# Patient Record
Sex: Female | Born: 1980
Health system: Southern US, Community
[De-identification: ages and names within clinical notes are randomized; demographics above are authoritative.]

## PROBLEM LIST (undated history)

## (undated) DIAGNOSIS — G44019 Episodic cluster headache, not intractable: Secondary | ICD-10-CM

## (undated) DIAGNOSIS — F419 Anxiety disorder, unspecified: Secondary | ICD-10-CM

## (undated) DIAGNOSIS — N289 Disorder of kidney and ureter, unspecified: Secondary | ICD-10-CM

## (undated) DIAGNOSIS — G43109 Migraine with aura, not intractable, without status migrainosus: Secondary | ICD-10-CM

## (undated) DIAGNOSIS — D509 Iron deficiency anemia, unspecified: Secondary | ICD-10-CM

## (undated) DIAGNOSIS — R61 Generalized hyperhidrosis: Secondary | ICD-10-CM

## (undated) HISTORY — DX: Episodic cluster headache, not intractable: G44.019

## (undated) HISTORY — DX: Migraine with aura, not intractable, without status migrainosus: G43.109

## (undated) HISTORY — DX: Anxiety disorder, unspecified: F41.9

## (undated) HISTORY — DX: Iron deficiency anemia, unspecified: D50.9

## (undated) HISTORY — DX: Disorder of kidney and ureter, unspecified: N28.9

## (undated) HISTORY — DX: Generalized hyperhidrosis: R61

---

## 2008-04-18 ENCOUNTER — Observation Stay: Payer: Self-pay | Admitting: Unknown Physician Specialty

## 2008-08-17 ENCOUNTER — Observation Stay: Payer: Self-pay

## 2008-10-24 ENCOUNTER — Inpatient Hospital Stay: Payer: Self-pay

## 2009-09-16 HISTORY — PX: HERNIA REPAIR: SHX51

## 2010-09-06 ENCOUNTER — Ambulatory Visit: Payer: Self-pay | Admitting: Surgery

## 2010-09-11 ENCOUNTER — Ambulatory Visit: Payer: Self-pay | Admitting: Surgery

## 2012-06-11 ENCOUNTER — Ambulatory Visit: Payer: Self-pay

## 2014-08-30 ENCOUNTER — Ambulatory Visit: Payer: Self-pay | Admitting: Family Medicine

## 2015-01-04 DIAGNOSIS — D509 Iron deficiency anemia, unspecified: Secondary | ICD-10-CM | POA: Insufficient documentation

## 2015-01-04 DIAGNOSIS — N289 Disorder of kidney and ureter, unspecified: Secondary | ICD-10-CM | POA: Insufficient documentation

## 2015-01-04 DIAGNOSIS — G43109 Migraine with aura, not intractable, without status migrainosus: Secondary | ICD-10-CM | POA: Insufficient documentation

## 2015-01-04 DIAGNOSIS — G44019 Episodic cluster headache, not intractable: Secondary | ICD-10-CM | POA: Insufficient documentation

## 2015-02-21 ENCOUNTER — Ambulatory Visit: Payer: BLUE CROSS/BLUE SHIELD | Admitting: Family Medicine

## 2015-05-02 ENCOUNTER — Other Ambulatory Visit: Payer: Self-pay | Admitting: Family Medicine

## 2015-05-02 NOTE — Telephone Encounter (Signed)
Your patient.  Thanks 

## 2015-05-02 NOTE — Telephone Encounter (Signed)
Needs appointment, then I'll get her enough to get to appointment.

## 2015-05-08 ENCOUNTER — Ambulatory Visit (INDEPENDENT_AMBULATORY_CARE_PROVIDER_SITE_OTHER): Payer: BLUE CROSS/BLUE SHIELD | Admitting: Family Medicine

## 2015-05-08 ENCOUNTER — Encounter: Payer: Self-pay | Admitting: Family Medicine

## 2015-05-08 VITALS — BP 130/88 | HR 65 | Temp 97.7°F | Ht 64.7 in | Wt 124.6 lb

## 2015-05-08 DIAGNOSIS — F411 Generalized anxiety disorder: Secondary | ICD-10-CM

## 2015-05-08 DIAGNOSIS — F419 Anxiety disorder, unspecified: Secondary | ICD-10-CM | POA: Insufficient documentation

## 2015-05-08 DIAGNOSIS — Z113 Encounter for screening for infections with a predominantly sexual mode of transmission: Secondary | ICD-10-CM

## 2015-05-08 MED ORDER — SERTRALINE HCL 100 MG PO TABS: 100.0000 mg | ORAL_TABLET | Freq: Every day | ORAL | Status: DC

## 2015-05-08 NOTE — Progress Notes (Signed)
BP 130/88 mmHg  Pulse 65  Temp(Src) 97.7 F (36.5 C)  Ht 5' 4.7" (1.643 m)  Wt 124 lb 9.6 oz (56.518 kg)  BMI 20.94 kg/m2  SpO2 100%  LMP 05/04/2015 (Approximate)   Subjective:    Patient ID: Miranda Best, female    DOB: March 14, 1981, 34 y.o.   MRN: [id number]  HPI: Miranda Best is a 34 y.o. female  Chief Complaint  Patient presents with  . Anxiety  . HIV Screening   ANXIETY/STRESS- Feels like medication is really working and that the stress she is under is just normal. Doesn't feel like the dose needs to be adjusted. Feels well.  Duration:better Anxious mood: yes  Excessive worrying: yes Irritability: no  Sweating: no Nausea: no Palpitations:no Hyperventilation: no Panic attacks: no Agoraphobia: no  Obscessions/compulsions: no Depressed mood: no Depression screen PHQ 2/9 05/08/2015  Decreased Interest 1  Down, Depressed, Hopeless 0  PHQ - 2 Score 1    GAD7: 7 Anhedonia: no Weight changes: no Insomnia: no   Hypersomnia: no Fatigue/loss of energy: no Feelings of worthlessness: no Feelings of guilt: no Impaired concentration/indecisiveness: no Suicidal ideations: no  Crying spells: no Recent Stressors/Life Changes: no   Relationship problems: no   Family stress: no     Financial stress: no    Job stress: no    Recent death/loss: no  Relevant past medical, surgical, family and social history reviewed and updated as indicated. Interim medical history since our last visit reviewed. Allergies and medications reviewed and updated.  Review of Systems  Constitutional: Negative.   Respiratory: Negative.   Cardiovascular: Negative.   Psychiatric/Behavioral: Negative.     Per HPI unless specifically indicated above     Objective:    BP 130/88 mmHg  Pulse 65  Temp(Src) 97.7 F (36.5 C)  Ht 5' 4.7" (1.643 m)  Wt 124 lb 9.6 oz (56.518 kg)  BMI 20.94 kg/m2  SpO2 100%  LMP 05/04/2015 (Approximate)  Wt Readings from Last 3 Encounters:  05/08/15  124 lb 9.6 oz (56.518 kg)  01/03/15 118 lb (53.524 kg)    Physical Exam  Constitutional: She is oriented to person, place, and time. She appears well-developed and well-nourished. No distress.  HENT:  Head: Normocephalic and atraumatic.  Right Ear: Hearing normal.  Left Ear: Hearing normal.  Nose: Nose normal.  Eyes: Conjunctivae and lids are normal. Right eye exhibits no discharge. Left eye exhibits no discharge. No scleral icterus.  Cardiovascular: Normal rate and regular rhythm.  Exam reveals no gallop and no friction rub.   No murmur heard. Pulmonary/Chest: Effort normal and breath sounds normal. No respiratory distress. She has no wheezes. She has no rales. She exhibits no tenderness.  Musculoskeletal: Normal range of motion.  Neurological: She is alert and oriented to person, place, and time.  Skin: Skin is warm, dry and intact. No rash noted. No erythema. No pallor.  Psychiatric: She has a normal mood and affect. Her speech is normal and behavior is normal. Judgment and thought content normal. Cognition and memory are normal.  Nursing note and vitals reviewed.   No results found for this or any previous visit.    Assessment & Plan:   Problem List Items Addressed This Visit      Other   Anxiety disorder - Primary    Doing very well on current regimen. Doesn't want to change dose. Will continue current regimen and follow up in 6 months. Continue to monitor.  Other Visit Diagnoses    Routine screening for STI (sexually transmitted infection)        No concern- just has not been tested. Drawn today.     Relevant Orders    HIV antibody        Follow up plan: Return in about 6 months (around 11/08/2015) for Follow up anxiety.

## 2015-05-08 NOTE — Patient Instructions (Signed)
Stress and Stress Management Stress is a normal reaction to life events. It is what you feel when life demands more than you are used to or more than you can handle. Some stress can be useful. For example, the stress reaction can help you catch the last bus of the day, study for a test, or meet a deadline at work. But stress that occurs too often or for too long can cause problems. It can affect your emotional health and interfere with relationships and normal daily activities. Too much stress can weaken your immune system and increase your risk for physical illness. If you already have a medical problem, stress can make it worse. CAUSES  All sorts of life events may cause stress. An event that causes stress for one person may not be stressful for another person. Major life events commonly cause stress. These may be positive or negative. Examples include losing your job, moving into a new home, getting married, having a baby, or losing a loved one. Less obvious life events may also cause stress, especially if they occur day after day or in combination. Examples include working long hours, driving in traffic, caring for children, being in debt, or being in a difficult relationship. SIGNS AND SYMPTOMS Stress may cause emotional symptoms including, the following:  Anxiety. This is feeling worried, afraid, on edge, overwhelmed, or out of control.  Anger. This is feeling irritated or impatient.  Depression. This is feeling sad, down, helpless, or guilty.  Difficulty focusing, remembering, or making decisions. Stress may cause physical symptoms, including the following:   Aches and pains. These may affect your head, neck, back, stomach, or other areas of your body.  Tight muscles or clenched jaw.  Low energy or trouble sleeping. Stress may cause unhealthy behaviors, including the following:   Eating to feel better (overeating) or skipping meals.  Sleeping too little, too much, or both.  Working  too much or putting off tasks (procrastination).  Smoking, drinking alcohol, or using drugs to feel better. DIAGNOSIS  Stress is diagnosed through an assessment by your health care provider. Your health care provider will ask questions about your symptoms and any stressful life events.Your health care provider will also ask about your medical history and may order blood tests or other tests. Certain medical conditions and medicine can cause physical symptoms similar to stress. Mental illness can cause emotional symptoms and unhealthy behaviors similar to stress. Your health care provider may refer you to a mental health professional for further evaluation.  TREATMENT  Stress management is the recommended treatment for stress.The goals of stress management are reducing stressful life events and coping with stress in healthy ways.  Techniques for reducing stressful life events include the following:  Stress identification. Self-monitor for stress and identify what causes stress for you. These skills may help you to avoid some stressful events.  Time management. Set your priorities, keep a calendar of events, and learn to say "no." These tools can help you avoid making too many commitments. Techniques for coping with stress include the following:  Rethinking the problem. Try to think realistically about stressful events rather than ignoring them or overreacting. Try to find the positives in a stressful situation rather than focusing on the negatives.  Exercise. Physical exercise can release both physical and emotional tension. The key is to find a form of exercise you enjoy and do it regularly.  Relaxation techniques. These relax the body and mind. Examples include yoga, meditation, tai chi, biofeedback, deep  breathing, progressive muscle relaxation, listening to music, being out in nature, journaling, and other hobbies. Again, the key is to find one or more that you enjoy and can do  regularly.  Healthy lifestyle. Eat a balanced diet, get plenty of sleep, and do not smoke. Avoid using alcohol or drugs to relax.  Strong support network. Spend time with family, friends, or other people you enjoy being around.Express your feelings and talk things over with someone you trust. Counseling or talktherapy with a mental health professional may be helpful if you are having difficulty managing stress on your own. Medicine is typically not recommended for the treatment of stress.Talk to your health care provider if you think you need medicine for symptoms of stress. HOME CARE INSTRUCTIONS  Keep all follow-up visits as directed by your health care provider.  Take all medicines as directed by your health care provider. SEEK MEDICAL CARE IF:  Your symptoms get worse or you start having new symptoms.  You feel overwhelmed by your problems and can no longer manage them on your own. SEEK IMMEDIATE MEDICAL CARE IF:  You feel like hurting yourself or someone else. Document Released: 02/26/2001 Document Revised: 01/17/2014 Document Reviewed: 04/27/2013 Exit[employer] Patient Information 2015 [employer]. This information is not intended to replace advice given to you by your health care provider. Make sure you discuss any questions you have with your health care provider.  

## 2015-05-08 NOTE — Assessment & Plan Note (Signed)
Doing very well on current regimen. Doesn't want to change dose. Will continue current regimen and follow up in 6 months. Continue to monitor.

## 2015-05-09 ENCOUNTER — Encounter: Payer: Self-pay | Admitting: Family Medicine

## 2015-05-09 LAB — HIV ANTIBODY (ROUTINE TESTING W REFLEX): HIV Screen 4th Generation wRfx: NONREACTIVE

## 2015-10-19 ENCOUNTER — Telehealth: Payer: Self-pay

## 2015-10-19 NOTE — Telephone Encounter (Signed)
OK to wait for her appointment.

## 2015-10-19 NOTE — Telephone Encounter (Signed)
[facility] is requesting a 90 day supply of Sertraline 100 mg tablets.  Patient is not due back until the end of this month.

## 2015-10-19 NOTE — Telephone Encounter (Signed)
Please schedule appt

## 2015-10-20 NOTE — Telephone Encounter (Signed)
Tried to call patient, phone is not working will try again on Monday.

## 2015-10-23 NOTE — Telephone Encounter (Signed)
Unable to reach by phone letter sent to last listed address.

## 2015-10-26 ENCOUNTER — Encounter: Payer: Self-pay | Admitting: Family Medicine

## 2015-10-26 ENCOUNTER — Ambulatory Visit (INDEPENDENT_AMBULATORY_CARE_PROVIDER_SITE_OTHER): Payer: BLUE CROSS/BLUE SHIELD | Admitting: Family Medicine

## 2015-10-26 VITALS — BP 118/74 | HR 76 | Temp 98.7°F | Ht 63.7 in | Wt 122.0 lb

## 2015-10-26 DIAGNOSIS — D509 Iron deficiency anemia, unspecified: Secondary | ICD-10-CM | POA: Diagnosis not present

## 2015-10-26 DIAGNOSIS — F411 Generalized anxiety disorder: Secondary | ICD-10-CM

## 2015-10-26 DIAGNOSIS — Z0000 Encounter for general adult medical examination without abnormal findings: Secondary | ICD-10-CM | POA: Diagnosis not present

## 2015-10-26 LAB — CBC WITH DIFFERENTIAL/PLATELET
HEMOGLOBIN: 12.3 g/dL (ref 11.1–15.9)
Hematocrit: 34.9 % (ref 34.0–46.6)
Lymphocytes Absolute: 1.2 10*3/uL (ref 0.7–3.1)
Lymphs: 33 %
MCH: 31.8 pg (ref 26.6–33.0)
MCHC: 35.2 g/dL (ref 31.5–35.7)
MCV: 90 fL (ref 79–97)
MID (ABSOLUTE): 0.5 10*3/uL (ref 0.1–1.6)
MID: 13 %
Neutrophils Absolute: 1.9 10*3/uL (ref 1.4–7.0)
Neutrophils: 54 %
Platelets: 152 10*3/uL (ref 150–379)
RBC: 3.87 x10E6/uL (ref 3.77–5.28)
RDW: 12.4 % (ref 12.3–15.4)
WBC: 3.6 10*3/uL (ref 3.4–10.8)

## 2015-10-26 MED ORDER — SERTRALINE HCL 100 MG PO TABS: 100.0000 mg | ORAL_TABLET | Freq: Every day | ORAL | Status: DC

## 2015-10-26 MED ORDER — SERTRALINE HCL 50 MG PO TABS: 100.0000 mg | ORAL_TABLET | Freq: Every day | ORAL | Status: DC

## 2015-10-26 MED ORDER — SERTRALINE HCL 50 MG PO TABS: 50.0000 mg | ORAL_TABLET | Freq: Every day | ORAL | Status: DC

## 2015-10-26 NOTE — Assessment & Plan Note (Signed)
Under good control. Continue current regimen. Continue to monitor. Recheck 6 months.  

## 2015-10-26 NOTE — Progress Notes (Signed)
BP 118/74 mmHg  Pulse 76  Temp(Src) 98.7 F (37.1 C)  Ht 5' 3.7" (1.618 m)  Wt 122 lb (55.339 kg)  BMI 21.14 kg/m2  SpO2 100%  LMP 10/10/2015 (Exact Date)   Subjective:    Patient ID: Miranda Best, female    DOB: December 01, 1980, 35 y.o.   MRN: [id number]  HPI: Miranda Best is a 35 y.o. female  Chief Complaint  Patient presents with  . Anxiety   ANXIETY/STRESS- has been out of her medicine for about 2 weeks, and has not been feeling well. Normally it works great with no issues.  Duration:exacerbated Anxious mood: yes  Excessive worrying: yes Irritability: yes  Sweating: no Nausea: no Palpitations:no Hyperventilation: no Panic attacks: no Agoraphobia: no  Obscessions/compulsions: no Depressed mood: no Depression screen [facility] 2/9 10/26/2015 05/08/2015  Decreased Interest 1 1  Down, Depressed, Hopeless 0 0  PHQ - 2 Score 1 1   GAD 7 : Generalized Anxiety Score 10/26/2015  Nervous, Anxious, on Edge 2  Control/stop worrying 2  Worry too much - different things 2  Trouble relaxing 2  Restless 2  Easily annoyed or irritable 3  Afraid - awful might happen 2  Total GAD 7 Score 15  Anxiety Difficulty Somewhat difficult   Anhedonia: no Weight changes: no Insomnia: no   Hypersomnia: no Fatigue/loss of energy: no Feelings of worthlessness: no Feelings of guilt: no Impaired concentration/indecisiveness: yes Suicidal ideations: no  Crying spells: no Recent Stressors/Life Changes: no  ANEMIA Anemia status: controlled Etiology of anemia: Iron Def. Duration of anemia treatment: 1 year Compliance with treatment: excellent compliance Iron supplementation side effects: no Severity of anemia: mild Fatigue: no Decreased exercise tolerance: no  Dyspnea on exertion: no Palpitations: no Bleeding: no Pica: no   Relevant past medical, surgical, family and social history reviewed and updated as indicated. Interim medical history since our last visit reviewed. Allergies and  medications reviewed and updated.  Review of Systems  Constitutional: Negative.   Respiratory: Negative.   Cardiovascular: Negative.   Musculoskeletal: Negative.   Psychiatric/Behavioral: Negative.     Per HPI unless specifically indicated above     Objective:    BP 118/74 mmHg  Pulse 76  Temp(Src) 98.7 F (37.1 C)  Ht 5' 3.7" (1.618 m)  Wt 122 lb (55.339 kg)  BMI 21.14 kg/m2  SpO2 100%  LMP 10/10/2015 (Exact Date)  Wt Readings from Last 3 Encounters:  10/26/15 122 lb (55.339 kg)  05/08/15 124 lb 9.6 oz (56.518 kg)  01/03/15 118 lb (53.524 kg)    Physical Exam  Constitutional: She is oriented to person, place, and time. She appears well-developed and well-nourished. No distress.  HENT:  Head: Normocephalic and atraumatic.  Right Ear: Hearing normal.  Left Ear: Hearing normal.  Nose: Nose normal.  Eyes: Conjunctivae and lids are normal. Right eye exhibits no discharge. Left eye exhibits no discharge. No scleral icterus.  Cardiovascular: Normal rate, regular rhythm, normal heart sounds and intact distal pulses.  Exam reveals no gallop and no friction rub.   No murmur heard. Pulmonary/Chest: Effort normal and breath sounds normal. No respiratory distress. She has no wheezes. She has no rales. She exhibits no tenderness.  Musculoskeletal: Normal range of motion.  Neurological: She is alert and oriented to person, place, and time.  Skin: Skin is warm, dry and intact. No rash noted. No erythema. No pallor.  Psychiatric: She has a normal mood and affect. Her speech is normal and behavior is normal. Judgment  and thought content normal. Cognition and memory are normal.  Nursing note and vitals reviewed.   Results for orders placed or performed in visit on 05/08/15  HIV antibody  Result Value Ref Range   HIV Screen 4th Generation wRfx Non Reactive Non Reactive      Assessment & Plan:   Problem List Items Addressed This Visit      Other   Iron deficiency anemia     Under good control on oral iron. Continue current regimen. Continue to monitor.       Relevant Orders   CBC With Differential/Platelet   Anxiety disorder - Primary    Under good control. Continue current regimen. Continue to monitor. Recheck 6 months.           Follow up plan: Return in about 6 months (around 04/24/2016) for Physical/Follow up Anxiety.

## 2015-10-26 NOTE — Assessment & Plan Note (Signed)
Under good control on oral iron. Continue current regimen. Continue to monitor.

## 2015-12-16 ENCOUNTER — Other Ambulatory Visit: Payer: Self-pay | Admitting: Family Medicine

## 2016-03-11 ENCOUNTER — Ambulatory Visit (INDEPENDENT_AMBULATORY_CARE_PROVIDER_SITE_OTHER): Payer: BLUE CROSS/BLUE SHIELD | Admitting: Family Medicine

## 2016-03-11 ENCOUNTER — Encounter: Payer: Self-pay | Admitting: Family Medicine

## 2016-03-11 VITALS — BP 119/75 | HR 76 | Temp 99.2°F | Ht 63.7 in | Wt 127.0 lb

## 2016-03-11 DIAGNOSIS — L259 Unspecified contact dermatitis, unspecified cause: Secondary | ICD-10-CM | POA: Diagnosis not present

## 2016-03-11 MED ORDER — TRIAMCINOLONE ACETONIDE 0.5 % EX OINT: 1.0000 "application " | TOPICAL_OINTMENT | Freq: Two times a day (BID) | CUTANEOUS | Status: DC

## 2016-03-11 NOTE — Progress Notes (Signed)
BP 119/75 mmHg  Pulse 76  Temp(Src) 99.2 F (37.3 C)  Ht 5' 3.7" (1.618 m)  Wt 127 lb (57.607 kg)  BMI 22.00 kg/m2  LMP 02/17/2016 (Exact Date)   Subjective:    Patient ID: Miranda Best, female    DOB: [birth year], 35 y.o.   MRN: [id number]  HPI: Miranda Best is a 35 y.o. female  Chief Complaint  Patient presents with  . Rash    back of neck, x 2weeks, itching and spreading   RASH Duration:  2 weeks  Location: back of her neck  Itching: yes Burning: yes Redness: yes Oozing: no Scaling: no Blisters: no Painful: no Fevers: no Change in detergents/soaps/personal care products: no Recent illness: no Recent travel:no History of same: no Context: worse Alleviating factors: nothing Treatments attempted:hydrocortisone cream Shortness of breath: no  Throat/tongue swelling: no Myalgias/arthralgias: no  Relevant past medical, surgical, family and social history reviewed and updated as indicated. Interim medical history since our last visit reviewed. Allergies and medications reviewed and updated.  Review of Systems  Constitutional: Negative.   Respiratory: Negative.   Cardiovascular: Negative.   Musculoskeletal: Negative.   Skin: Positive for rash. Negative for color change, pallor and wound.  Psychiatric/Behavioral: Negative.     Per HPI unless specifically indicated above     Objective:    BP 119/75 mmHg  Pulse 76  Temp(Src) 99.2 F (37.3 C)  Ht 5' 3.7" (1.618 m)  Wt 127 lb (57.607 kg)  BMI 22.00 kg/m2  LMP 02/17/2016 (Exact Date)  Wt Readings from Last 3 Encounters:  03/11/16 127 lb (57.607 kg)  10/26/15 122 lb (55.339 kg)  05/08/15 124 lb 9.6 oz (56.518 kg)    Physical Exam  Constitutional: She is oriented to person, place, and time. She appears well-developed and well-nourished. No distress.  HENT:  Head: Normocephalic and atraumatic.  Right Ear: Hearing normal.  Left Ear: Hearing normal.  Nose: Nose normal.  Eyes: Conjunctivae and lids  are normal. Right eye exhibits no discharge. Left eye exhibits no discharge. No scleral icterus.  Pulmonary/Chest: Effort normal. No respiratory distress.  Musculoskeletal: Normal range of motion.  Neurological: She is alert and oriented to person, place, and time.  Skin: Skin is warm and intact. Rash noted. No erythema. No pallor.  Erythematous pustular rash in the creases of her neck  Psychiatric: She has a normal mood and affect. Her speech is normal and behavior is normal. Judgment and thought content normal. Cognition and memory are normal.  Nursing note and vitals reviewed.   Results for orders placed or performed in visit on 10/26/15  CBC With Differential/Platelet  Result Value Ref Range   WBC 3.6 3.4 - 10.8 x10E3/uL   RBC 3.87 3.77 - 5.28 x10E6/uL   Hemoglobin 12.3 11.1 - 15.9 g/dL   Hematocrit 40.934.9 81.134.0 - 46.6 %   MCV 90 79 - 97 fL   MCH 31.8 26.6 - 33.0 pg   MCHC 35.2 31.5 - 35.7 g/dL   RDW 91.412.4 78.212.3 - 95.615.4 %   Platelets 152 150 - 379 x10E3/uL   Neutrophils 54 %   Lymphs 33 %   MID 13 %   Neutrophils Absolute 1.9 1.4 - 7.0 x10E3/uL   Lymphocytes Absolute 1.2 0.7 - 3.1 x10E3/uL   MID (Absolute) 0.5 0.1 - 1.6 X10E3/uL      Assessment & Plan:   Problem List Items Addressed This Visit    None    Visit Diagnoses    Contact  dermatitis    -  Primary    Will treat with triamcinalone cream. Call if not getting better or getting worse.         Follow up plan: Return if symptoms worsen or fail to improve.

## 2016-04-11 ENCOUNTER — Encounter (INDEPENDENT_AMBULATORY_CARE_PROVIDER_SITE_OTHER): Payer: Self-pay

## 2016-04-26 ENCOUNTER — Encounter: Payer: BLUE CROSS/BLUE SHIELD | Admitting: Family Medicine

## 2016-05-10 ENCOUNTER — Encounter: Payer: BLUE CROSS/BLUE SHIELD | Admitting: Family Medicine

## 2016-06-25 ENCOUNTER — Ambulatory Visit: Payer: BLUE CROSS/BLUE SHIELD | Admitting: Family Medicine

## 2016-07-18 ENCOUNTER — Telehealth: Payer: Self-pay | Admitting: Family Medicine

## 2016-07-18 NOTE — Telephone Encounter (Signed)
Pt called to make an appt about her medication

## 2016-07-23 ENCOUNTER — Ambulatory Visit: Payer: Self-pay | Admitting: Family Medicine

## 2017-09-10 DIAGNOSIS — H5213 Myopia, bilateral: Secondary | ICD-10-CM | POA: Diagnosis not present

## 2017-10-03 DIAGNOSIS — R238 Other skin changes: Secondary | ICD-10-CM | POA: Diagnosis not present

## 2017-10-03 DIAGNOSIS — F411 Generalized anxiety disorder: Secondary | ICD-10-CM | POA: Diagnosis not present

## 2017-10-03 DIAGNOSIS — D500 Iron deficiency anemia secondary to blood loss (chronic): Secondary | ICD-10-CM | POA: Diagnosis not present

## 2017-10-03 DIAGNOSIS — R5383 Other fatigue: Secondary | ICD-10-CM | POA: Diagnosis not present

## 2017-10-03 DIAGNOSIS — Z0000 Encounter for general adult medical examination without abnormal findings: Secondary | ICD-10-CM | POA: Diagnosis not present

## 2017-10-03 DIAGNOSIS — N289 Disorder of kidney and ureter, unspecified: Secondary | ICD-10-CM | POA: Diagnosis not present

## 2017-10-03 DIAGNOSIS — L259 Unspecified contact dermatitis, unspecified cause: Secondary | ICD-10-CM | POA: Diagnosis not present

## 2017-12-05 DIAGNOSIS — Z3042 Encounter for surveillance of injectable contraceptive: Secondary | ICD-10-CM | POA: Diagnosis not present

## 2017-12-05 DIAGNOSIS — Z3202 Encounter for pregnancy test, result negative: Secondary | ICD-10-CM | POA: Diagnosis not present

## 2017-12-05 DIAGNOSIS — Z01419 Encounter for gynecological examination (general) (routine) without abnormal findings: Secondary | ICD-10-CM | POA: Diagnosis not present

## 2017-12-24 ENCOUNTER — Other Ambulatory Visit: Payer: Self-pay

## 2017-12-24 ENCOUNTER — Encounter: Payer: Self-pay | Admitting: Emergency Medicine

## 2017-12-24 ENCOUNTER — Ambulatory Visit
Admission: EM | Admit: 2017-12-24 | Discharge: 2017-12-24 | Disposition: A | Discharge status: Home / Self Care | Payer: 59 | Attending: Family Medicine | Admitting: Family Medicine

## 2017-12-24 DIAGNOSIS — J302 Other seasonal allergic rhinitis: Secondary | ICD-10-CM

## 2017-12-24 DIAGNOSIS — R0981 Nasal congestion: Secondary | ICD-10-CM | POA: Diagnosis not present

## 2017-12-24 DIAGNOSIS — J029 Acute pharyngitis, unspecified: Secondary | ICD-10-CM | POA: Diagnosis not present

## 2017-12-24 LAB — RAPID STREP SCREEN (MED CTR MEBANE ONLY): STREPTOCOCCUS, GROUP A SCREEN (DIRECT): NEGATIVE

## 2017-12-24 MED ORDER — FLUTICASONE PROPIONATE 50 MCG/ACT NA SUSP: 1.0000 | Freq: Every day | NASAL | 0 refills | Status: DC

## 2017-12-24 NOTE — ED Provider Notes (Signed)
[facility] ____________________________________________  Time seen: Approximately 0930 Am  I have reviewed the triage vital signs and the nursing notes.   HISTORY  Chief Complaint Nasal Congestion   HPI Miranda Best is a 37 y.o. female presenting for evaluation of 1 week of runny nose, nasal congestion, sneezing, coughing and puffy eyes.  States she does have a history of seasonal allergies with similar presentation, but states over-the-counter Allegra and Claritin have not seem to been doing anything for her.  Denies accompanying fever.  Reports some sore throat, described as mild but has been persistent.  Continues to eat and drink well.  States she has had to be outside a lot this week due to her son's baseball game, and feels pollen aggravating symptoms.  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  Has continue to remain active.  Denies sinus pain or pressure. Denies other aggravating or alleviating factors.  Percent of his feels well. Denies chest pain, shortness of breath, abdominal pain, or rash. Denies recent sickness. Denies recent antibiotic use.  Denies cardiac history or renal insufficiency.  Johnson, Megan P, DO: PCP No LMP recorded. Patient has had an injection.  Denies pregnancy.   Past Medical History:  Diagnosis Date  . Anxiety   . Episodic cluster headache   . Iron deficiency anemia   . Migraine with aura   . Night sweats    Thought to be from SSRI  . Renal insufficiency     Patient Active Problem List   Diagnosis Date Noted  . Anxiety disorder 05/08/2015  . Migraine with aura   . Iron deficiency anemia   . Renal insufficiency   . Episodic cluster headache     Past Surgical History:  Procedure Laterality Date  . HERNIA REPAIR  2011     No current facility-administered medications for this encounter.   Current Outpatient Medications:  .  medroxyPROGESTERone (DEPO-PROVERA) 150 MG/ML injection, inject 1 milliliter  intramuscularly every 11 to 12 WEEKS, Disp: , Rfl: 0 .  sertraline (ZOLOFT) 100 MG tablet, Take 1 tablet (100 mg total) by mouth daily., Disp: 90 tablet, Rfl: 3 .  triamcinolone ointment (KENALOG) 0.5 %, Apply 1 application topically 2 (two) times daily., Disp: 30 g, Rfl: 0 .  fluticasone (FLONASE) 50 MCG/ACT nasal spray, Place 1 spray into both nostrils daily., Disp: 1 g, Rfl: 0  Allergies Patient has no known allergies.  Family History  Problem Relation Age of Onset  . Migraines Mother   . Alzheimer's disease Maternal Grandmother   . Hypertension Paternal Grandmother   . Hypertension Father     Social History Social History   Tobacco Use  . Smoking status: Never Smoker  . Smokeless tobacco: Never Used  Substance Use Topics  . Alcohol use: Yes    Comment: Rarely  . Drug use: No    Review of Systems Constitutional: No fever/chills ENT: As above.  Cardiovascular: Denies chest pain. Respiratory: Denies shortness of breath. Gastrointestinal: No abdominal pain.  No nausea, no vomiting.  No diarrhea.   Genitourinary: Negative for dysuria. Musculoskeletal: Negative for back pain. Skin: Negative for rash.   ____________________________________________   PHYSICAL EXAM:  VITAL SIGNS: ED Triage Vitals  Enc Vitals Group     BP 12/24/17 0829 120/90     Pulse Rate 12/24/17 0829 82     Resp 12/24/17 0829 16     Temp 12/24/17 0829 99.2 F (37.3 C)     Temp Source 12/24/17 0829 Oral  SpO2 12/24/17 0829 100 %     Weight 12/24/17 0828 133 lb (60.3 kg)     Height 12/24/17 0828 5\' 4"  (1.626 m)     Head Circumference --      Peak Flow --      Pain Score 12/24/17 0828 9     Pain Loc --      Pain Edu? --      Excl. in GC? --    Constitutional: Alert and oriented. Well appearing and in no acute distress. Eyes: Conjunctivae are normal.  Head: Atraumatic. No sinus tenderness to palpation. No swelling. No erythema.  Ears: no erythema, normal TMs bilaterally.   Nose:Nasal  congestion with clear rhinorrhea  Mouth/Throat: Mucous membranes are moist. Mild pharyngeal erythema. No tonsillar swelling or exudate.  Posterior pharyngeal cobblestoning noted. Neck: No stridor.  No cervical spine tenderness to palpation. Hematological/Lymphatic/Immunilogical: No cervical lymphadenopathy. Cardiovascular: Normal rate, regular rhythm. Grossly normal heart sounds.  Good peripheral circulation. Respiratory: Normal respiratory effort.  No retractions. No wheezes, rales or rhonchi. Good air movement.  Musculoskeletal: Ambulatory with steady gait.  Neurologic:  Normal speech and language. No gait instability. Skin:  Skin appears warm, dry and intact. No rash noted. Psychiatric: Mood and affect are normal. Speech and behavior are normal.  ___________________________________________   LABS (all labs ordered are listed, but only abnormal results are displayed)  Labs Reviewed  RAPID STREP SCREEN ([facility] ONLY)  CULTURE, GROUP A STREP [facility])   ____________________________________________   PROCEDURES Procedures    INITIAL IMPRESSION / ASSESSMENT AND PLAN / ED COURSE  Pertinent labs & imaging results that were available during my care of the patient were reviewed by me and considered in my medical decision making (see chart for details).  Well-appearing patient.  No acute distress.  Quick strep negative, will culture.  Suspect allergic rhinitis.  Denies fever.  No sinus pressure.  Discussed no indication for antibiotic use at this time.  Encouraged over-the-counter antihistamine continued use, Rx for Flonase given, discussed over-the-counter Sudafed.  Encourage rest, fluids, supportive care and avoidance of triggers. Discussed indication, risks and benefits of medications with patient.  Discussed follow up with Primary care physician this week. Discussed follow up and return parameters including no resolution or any worsening concerns. Patient verbalized understanding and  agreed to plan.   ____________________________________________   FINAL CLINICAL IMPRESSION(S) / ED DIAGNOSES  Final diagnoses:  Seasonal allergic rhinitis, unspecified trigger     ED Discharge Orders        Ordered    fluticasone (FLONASE) 50 MCG/ACT nasal spray  Daily     12/24/17 0853       Note: This dictation was prepared with Dragon dictation along with smaller phrase technology. Any transcriptional errors that result from this process are unintentional.         Renford Dills, NP 12/24/17 1318

## 2017-12-24 NOTE — Discharge Instructions (Addendum)
Take medication as prescribed. Rest. Drink plenty of fluids.  ° °Follow up with your primary care physician this week as needed. Return to Urgent care for new or worsening concerns.  ° °

## 2017-12-24 NOTE — ED Triage Notes (Signed)
Patient in today c/o runny nose, sore throat, sneezing, coughing and puffy eyes x 1 week. Patient has tried OTC Claritin and Allegra. Patient denies fever.

## 2017-12-26 LAB — CULTURE, GROUP A STREP (THRC)

## 2018-02-20 DIAGNOSIS — Z3042 Encounter for surveillance of injectable contraceptive: Secondary | ICD-10-CM | POA: Diagnosis not present

## 2019-04-27 ENCOUNTER — Other Ambulatory Visit: Payer: Self-pay

## 2019-04-27 ENCOUNTER — Ambulatory Visit
Admission: EM | Admit: 2019-04-27 | Discharge: 2019-04-27 | Disposition: A | Discharge status: Home / Self Care | Payer: 59 | Attending: Urgent Care | Admitting: Urgent Care

## 2019-04-27 DIAGNOSIS — L259 Unspecified contact dermatitis, unspecified cause: Secondary | ICD-10-CM

## 2019-04-27 DIAGNOSIS — L03221 Cellulitis of neck: Secondary | ICD-10-CM

## 2019-04-27 MED ORDER — DEXAMETHASONE SODIUM PHOSPHATE 10 MG/ML IJ SOLN
10.0000 mg | Freq: Once | INTRAMUSCULAR | Status: AC
  Administered 2019-04-27: 18:00:00 10 mg via INTRAMUSCULAR

## 2019-04-27 MED ORDER — PREDNISONE 10 MG (21) PO TBPK: ORAL_TABLET | ORAL | 0 refills | Status: AC

## 2019-04-27 MED ORDER — DOXYCYCLINE HYCLATE 100 MG PO CAPS: 100.0000 mg | ORAL_CAPSULE | Freq: Two times a day (BID) | ORAL | 0 refills | Status: AC

## 2019-04-27 NOTE — ED Triage Notes (Signed)
Patient complains of rash all around neck that started on Sunday. Patient rash is raised and red, itches and burning.

## 2019-04-27 NOTE — Discharge Instructions (Addendum)
It was very nice seeing you today in clinic. Thank you for entrusting me with your care.  ° °Please utilize the medications that we discussed. Your prescriptions have been called in to your pharmacy.  ° °Make arrangements to follow up with your regular doctor in 1 week for re-evaluation if not improving. If your symptoms/condition worsens, please seek follow up care either here or in the ER. Please remember, our [facility] providers are "right here with you" when you need us.  ° °Again, it was my pleasure to take care of you today. Thank you for choosing our clinic. I hope that you start to feel better quickly.  ° °Shannon Balthazar, MSN, APRN, FNP-C, CEN °Advanced Practice Provider °[facility] ° °

## 2019-04-27 NOTE — ED Provider Notes (Signed)
[place]   Name: Miranda Best DOB: 1981/07/01 MRN: [id number] CSN: [id number] PCP: Dorcas CarrowJohnson, Megan P, DO  Arrival date and time:  04/27/19 1708  Chief Complaint:  Rash   NOTE: Prior to seeing the patient today, I have reviewed the triage nursing documentation and vital signs. Clinical staff has updated patient's PMH/PSHx, current medication list, and drug allergies/intolerances to ensure comprehensive history available to assist in medical decision making.   History:   HPI: Miranda Best is a 38 y.o. female who presents today with complaints of rash to her neck that initially declared 2 days ago. Rash started to the front of her neck and has progressively spread circumferentially. Patient denies any new medications, foods, soaps/body washes, laundry detergents, or cosmetics. She does not wear any types of necklaces. She denies anything being placed around her neck beyond her normal clothing, which his typically cotton based fabrics. She denies a past medical history significant for environmental allergies. She advises that she has never developed a skin eruption like this in the past. Rash is erythematous and indurated. She describes it as being both pruritic and painful (burning). She has not appreciated any drainage associated with the rash. There is no facial involvement; no rash to periorbital, paranasal, or perioral areas. Patient denies that she is not experiencing any shortness of breath or sensation of pharyngeal/laryngeal fullness. Despite her symptoms, patient has not taken any over the counter interventions to help improve/relieve her reported symptoms at home.   Past Medical History:  Diagnosis Date  . Anxiety   . Episodic cluster headache   . Iron deficiency anemia   . Migraine with aura   . Night sweats    Thought to be from SSRI  . Renal insufficiency     Past Surgical History:  Procedure Laterality Date  . HERNIA REPAIR  2011    Family History  Problem  Relation Age of Onset  . Migraines Mother   . Alzheimer's disease Maternal Grandmother   . Hypertension Paternal Grandmother   . Hypertension Father     Social History   Tobacco Use  . Smoking status: Never Smoker  . Smokeless tobacco: Never Used  Substance Use Topics  . Alcohol use: Yes    Comment: Rarely  . Drug use: No    Patient Active Problem List   Diagnosis Date Noted  . Anxiety disorder 05/08/2015  . Migraine with aura   . Iron deficiency anemia   . Renal insufficiency   . Episodic cluster headache     Home Medications:    No outpatient medications have been marked as taking for the 04/27/19 encounter [facility](Hospital Encounter).    Allergies:   Patient has no known allergies.  Review of Systems (ROS): Review of Systems  Constitutional: Negative for chills and fever.  Respiratory: Negative for cough, chest tightness, shortness of breath and wheezing.   Cardiovascular: Negative for chest pain and palpitations.  Skin: Positive for color change and rash.  Neurological: Negative for dizziness, syncope, weakness, numbness and headaches.  Hematological: Negative for adenopathy.  Psychiatric/Behavioral: The patient is nervous/anxious.   All other systems reviewed and are negative.    Vital Signs: Today's Vitals   04/27/19 1726 04/27/19 1728 04/27/19 1757  BP:  (!) 144/99   Pulse:  79   Resp:  18   Temp:  98.2 F (36.8 C)   TempSrc:  Oral   SpO2:  100%   Weight: 138 lb (62.6 kg)    Height: 5'  4" (1.626 m)    PainSc: 8   8     Physical Exam: Physical Exam  Constitutional: She is oriented to person, place, and time and well-developed, well-nourished, and in no distress.  HENT:  Head: Normocephalic and atraumatic.  Mouth/Throat: Uvula is midline, oropharynx is clear and moist and mucous membranes are normal. No posterior oropharyngeal edema or posterior oropharyngeal erythema.  Neck: Trachea normal and normal range of motion. Neck supple.  Cardiovascular:  Normal rate, regular rhythm, normal heart sounds and intact distal pulses. Exam reveals no gallop and no friction rub.  No murmur heard. Pulmonary/Chest: Effort normal and breath sounds normal. No respiratory distress. She has no wheezes. She has no rales.  Neurological: She is alert and oriented to person, place, and time. Gait normal. GCS score is 15.  Skin: Skin is warm and dry. Rash noted.  Rash wraps circumferentially around neck; spares about a 1-2 inch area to the posterior aspect of the neck. Fine maculopapular rash extends distally onto the chest wall, culminating just inferior to the clavicle. Area warm, erythematous, and significantly indurated. Exquisitely TTP. Patient with areas of cracking/compromised skin integrity posteriorly. See attached medical photo of LEFT lateal neck.   Psychiatric: Mood, memory, affect and judgment normal.  Nursing note and vitals reviewed.     Urgent Care Treatments / Results:   LABS: PLEASE NOTE: all labs that were ordered this encounter are listed, however only abnormal results are displayed. Labs Reviewed - No data to display  EKG: -None  RADIOLOGY: No results found.  PROCEDURES: Procedures  MEDICATIONS RECEIVED THIS VISIT: Medications  dexamethasone (DECADRON) injection 10 mg (10 mg Intramuscular Given 04/27/19 1754)    PERTINENT CLINICAL COURSE NOTES/UPDATES:   Initial Impression / Assessment and Plan / Urgent Care Course:  Pertinent labs & imaging results that were available during my care of the patient were personally reviewed by me and considered in my medical decision making (see lab/imaging section of note for values and interpretations).  Miranda Best is a 38 y.o. female who presents to [facility] today with complaints of Rash   Patient is well appearing overall in clinic today. She does not appear to be in any acute distress. Presenting symptoms (see HPI) and exam as documented above. Rash is significant. Pain,  and fear of the unknown, distressing to the patient. Eruption appears to be some sort of contact dermatitis. Initial plan for systemic steroids, however based on the appearance of the rash and it being on the neck, I wanted to get a second opinion.  Asked attending physician Lacinda Axon, DO) to look at the rash with me while patient in clinic. DO agrees that rash is atypical and most consistent with some form of contact dermatitis. Patient unable to identify triggering exposure. DO concerned about posterior neck with areas of cracking/compromised skin integrity. Recommending adding course of doxycycline to cover for cellulitis.   Treatment as follows:  Dexamethasone 10 mg IM today in clinic.   Rx: 10 day steroid taper (starts 04/28/2019)  Rx: 10 day course of doxycycline.   Cool compresses to help soothe skin.   Monitor for signs and symptoms of infection, which would include increased redness, swelling, streaking, drainage, pain, and the development of a fever.   May use Tylenol and/or Ibuprofen as needed for pain.  Follow up with primary care physician in 1 week for re-evaluation. If not improving, patient will need to see dermatology for a biopsy of the area to determine definitive diagnosis.  I have reviewed the follow up and strict return precautions for any new or worsening symptoms. Patient is aware of symptoms that would be deemed urgent/emergent, and would thus require further evaluation either here or in the emergency department. At the time of discharge, she verbalized understanding and consent with the discharge plan as it was reviewed with her. All questions were fielded by provider and/or clinic staff prior to patient discharge.    Final Clinical Impressions / Urgent Care Diagnoses:   Final diagnoses:  Contact dermatitis, unspecified contact dermatitis type, unspecified trigger  Cellulitis of neck    New Prescriptions:  [place] Controlled Substance Registry consulted? No  Meds  ordered this encounter  Medications  . dexamethasone (DECADRON) injection 10 mg  . predniSONE (STERAPRED UNI-PAK 21 TAB) 10 MG (21) TBPK tablet    Sig: Take 6 tabs by mouth daily  for 2 days, then 5 tabs for 2 days, then 4 tabs for 2 days, then 3 tabs for 2 days, 2 tabs for 2 days, then 1 tab by mouth daily for 2 days    Dispense:  42 tablet    Refill:  0  . doxycycline (VIBRAMYCIN) 100 MG capsule    Sig: Take 1 capsule (100 mg total) by mouth 2 (two) times daily.    Dispense:  20 capsule    Refill:  0    Recommended Follow up Care:  Patient encouraged to follow up with the following provider within the specified time frame, or sooner as dictated by the severity of her symptoms. As always, she was instructed that for any urgent/emergent care needs, she should seek care either here or in the emergency department for more immediate evaluation.  Follow-up Information    Laural BenesJohnson, Megan P, DO In 1 week.   Specialty: Family Medicine Contact information: [address] [place] [address] [phone]         NOTE: This note was prepared using Dragon dictation software along with smaller phrase technology. Despite my best ability to proofread, there is the potential that transcriptional errors may still occur from this process, and are completely unintentional.    Verlee MonteGray, Demontrez Rindfleisch E, NP 04/28/19 1300

## 2019-12-04 ENCOUNTER — Ambulatory Visit: Payer: Managed Care, Other (non HMO)

## 2019-12-14 ENCOUNTER — Other Ambulatory Visit: Payer: Self-pay | Admitting: Orthopedic Surgery

## 2019-12-14 DIAGNOSIS — M222X2 Patellofemoral disorders, left knee: Secondary | ICD-10-CM

## 2019-12-26 ENCOUNTER — Ambulatory Visit: Payer: Managed Care, Other (non HMO)

## 2020-01-20 ENCOUNTER — Other Ambulatory Visit: Payer: Self-pay

## 2020-01-20 ENCOUNTER — Ambulatory Visit
Admission: RE | Admit: 2020-01-20 | Discharge: 2020-01-20 | Disposition: A | Discharge status: Home / Self Care | Payer: Managed Care, Other (non HMO) | Source: Ambulatory Visit | Attending: Orthopedic Surgery | Admitting: Orthopedic Surgery

## 2020-01-20 DIAGNOSIS — M222X2 Patellofemoral disorders, left knee: Secondary | ICD-10-CM | POA: Insufficient documentation

## 2020-11-20 ENCOUNTER — Other Ambulatory Visit: Payer: Self-pay | Admitting: Obstetrics and Gynecology

## 2020-11-20 DIAGNOSIS — N920 Excessive and frequent menstruation with regular cycle: Secondary | ICD-10-CM

## 2021-02-02 ENCOUNTER — Ambulatory Visit: Payer: Self-pay | Admitting: Family Medicine

## 2021-02-06 ENCOUNTER — Ambulatory Visit: Payer: Self-pay | Admitting: Family Medicine

## 2021-03-15 ENCOUNTER — Encounter: Payer: Self-pay | Admitting: *Deleted

## 2021-03-15 ENCOUNTER — Emergency Department
Admission: EM | Admit: 2021-03-15 | Discharge: 2021-03-15 | Disposition: A | Discharge status: Home / Self Care | Payer: Managed Care, Other (non HMO) | Attending: Emergency Medicine | Admitting: Emergency Medicine

## 2021-03-15 ENCOUNTER — Other Ambulatory Visit: Payer: Self-pay

## 2021-03-15 DIAGNOSIS — R112 Nausea with vomiting, unspecified: Secondary | ICD-10-CM | POA: Diagnosis not present

## 2021-03-15 DIAGNOSIS — Z5321 Procedure and treatment not carried out due to patient leaving prior to being seen by health care provider: Secondary | ICD-10-CM | POA: Diagnosis not present

## 2021-03-15 DIAGNOSIS — R531 Weakness: Secondary | ICD-10-CM | POA: Insufficient documentation

## 2021-03-15 LAB — BASIC METABOLIC PANEL
Anion gap: 8 (ref 5–15)
BUN: 13 mg/dL (ref 6–20)
CO2: 22 mmol/L (ref 22–32)
Calcium: 8.4 mg/dL — ABNORMAL LOW (ref 8.9–10.3)
Chloride: 107 mmol/L (ref 98–111)
Creatinine, Ser: 1 mg/dL (ref 0.44–1.00)
GFR, Estimated: >60 mL/min (ref >60)
Glucose, Bld: 143 mg/dL — ABNORMAL HIGH (ref 70–99)
Potassium: 3 mmol/L — ABNORMAL LOW (ref 3.5–5.1)
Sodium: 137 mmol/L (ref 135–145)

## 2021-03-15 LAB — CBC
HCT: 29.4 % — ABNORMAL LOW (ref 36.0–46.0)
Hemoglobin: 10.4 g/dL — ABNORMAL LOW (ref 12.0–15.0)
MCH: 32.7 pg (ref 26.0–34.0)
MCHC: 35.4 g/dL (ref 30.0–36.0)
MCV: 92.5 fL (ref 80.0–100.0)
Platelets: 159 10*3/uL (ref 150–400)
RBC: 3.18 MIL/uL — ABNORMAL LOW (ref 3.87–5.11)
RDW: 12.1 % (ref 11.5–15.5)
WBC: 7.5 10*3/uL (ref 4.0–10.5)
nRBC: 0 % (ref 0.0–0.2)

## 2021-03-15 LAB — TROPONIN I (HIGH SENSITIVITY): Troponin I (High Sensitivity): <2 ng/L (ref <18)

## 2021-03-15 LAB — LIPASE, BLOOD: Lipase: 34 U/L (ref 11–51)

## 2021-03-15 NOTE — ED Notes (Signed)
20GA SL to left FA; d/c'd at pt request, cannula intact, dsng applied

## 2021-03-15 NOTE — ED Triage Notes (Signed)
Pt brought in via ems from home.  Pt reports feeling weak for 1 hour with n/v.  Iv in place 4mg  zofran given by ems.  Pt alert.

## 2021-03-23 ENCOUNTER — Other Ambulatory Visit: Payer: Self-pay | Admitting: Infectious Diseases

## 2021-03-23 DIAGNOSIS — R55 Syncope and collapse: Secondary | ICD-10-CM

## 2021-03-23 DIAGNOSIS — R61 Generalized hyperhidrosis: Secondary | ICD-10-CM

## 2021-03-23 DIAGNOSIS — R1900 Intra-abdominal and pelvic swelling, mass and lump, unspecified site: Secondary | ICD-10-CM

## 2021-03-23 DIAGNOSIS — R634 Abnormal weight loss: Secondary | ICD-10-CM

## 2021-03-27 ENCOUNTER — Other Ambulatory Visit: Payer: Self-pay | Admitting: Infectious Diseases

## 2021-03-27 DIAGNOSIS — R61 Generalized hyperhidrosis: Secondary | ICD-10-CM

## 2021-03-27 DIAGNOSIS — R634 Abnormal weight loss: Secondary | ICD-10-CM

## 2021-03-27 DIAGNOSIS — R55 Syncope and collapse: Secondary | ICD-10-CM

## 2021-03-27 DIAGNOSIS — R1900 Intra-abdominal and pelvic swelling, mass and lump, unspecified site: Secondary | ICD-10-CM

## 2021-03-30 ENCOUNTER — Other Ambulatory Visit: Payer: Self-pay

## 2021-03-30 ENCOUNTER — Ambulatory Visit
Admission: RE | Admit: 2021-03-30 | Discharge: 2021-03-30 | Disposition: A | Discharge status: Home / Self Care | Payer: Managed Care, Other (non HMO) | Source: Ambulatory Visit | Attending: Infectious Diseases | Admitting: Infectious Diseases

## 2021-03-30 DIAGNOSIS — R61 Generalized hyperhidrosis: Secondary | ICD-10-CM | POA: Insufficient documentation

## 2021-03-30 DIAGNOSIS — R55 Syncope and collapse: Secondary | ICD-10-CM | POA: Insufficient documentation

## 2021-03-30 DIAGNOSIS — R634 Abnormal weight loss: Secondary | ICD-10-CM | POA: Insufficient documentation

## 2021-03-30 DIAGNOSIS — R1900 Intra-abdominal and pelvic swelling, mass and lump, unspecified site: Secondary | ICD-10-CM | POA: Diagnosis present

## 2021-03-30 MED ORDER — IOHEXOL 300 MG/ML  SOLN
100.0000 mL | Freq: Once | INTRAMUSCULAR | Status: AC | PRN
  Administered 2021-03-30: 100 mL via INTRAVENOUS

## 2021-04-03 ENCOUNTER — Ambulatory Visit: Payer: Managed Care, Other (non HMO)

## 2021-10-27 IMAGING — CT CT CHEST-ABD-PELV W/ CM
2 of 4 series · 13 of 36 positions shown, 15 images · IV contrast (omnipaque)
Comparison: No priors.

CLINICAL DATA: 39-year-old female with history of weight loss of
more than 10% of body weight. Night sweats. Syncope. Palpable
abdominal mass.

EXAM:
CT CHEST, ABDOMEN, AND PELVIS WITH CONTRAST
TECHNIQUE: Multidetector CT imaging of the chest, abdomen and pelvis was
performed following the standard protocol during bolus
administration of intravenous contrast.
CONTRAST:  100mL OMNIPAQUE IOHEXOL 300 MG/ML  SOLN

[Series 2: cap with · axial · 0.76mm/px · z∈[-557,-22]mm · 10 of 129 slices shown, 12 images]
[im 11/129  mediastinal]
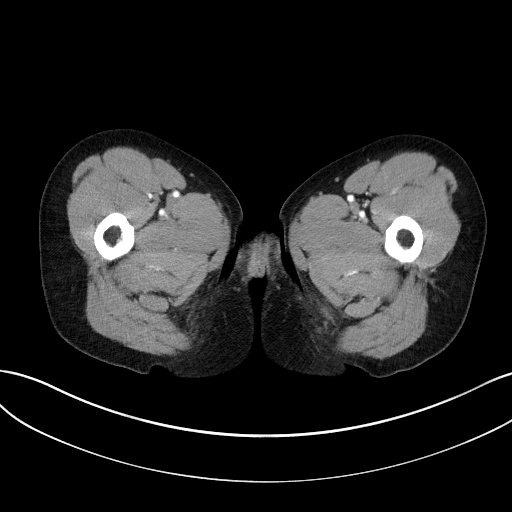
[im 11/129  bone]
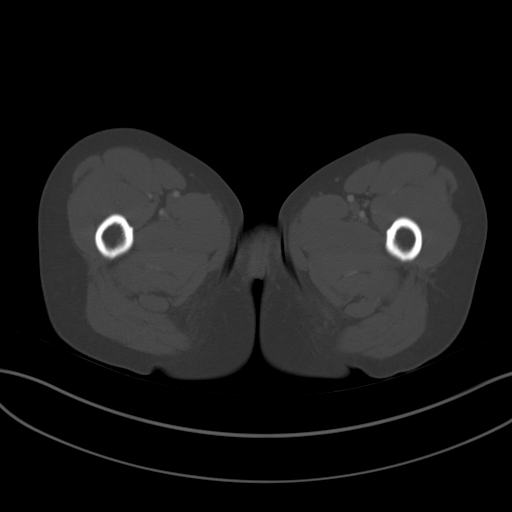
[im 22/129  mediastinal]
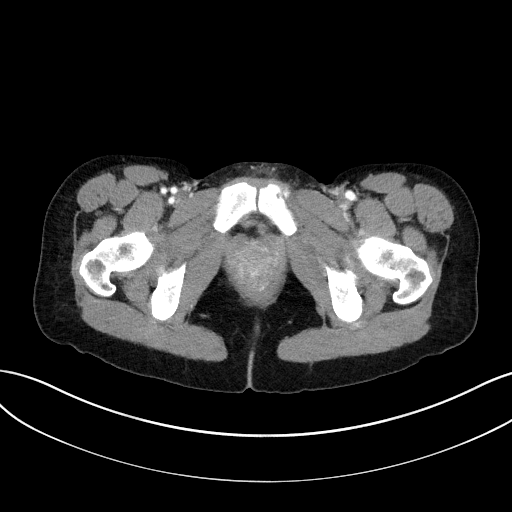
[im 33/129  mediastinal]
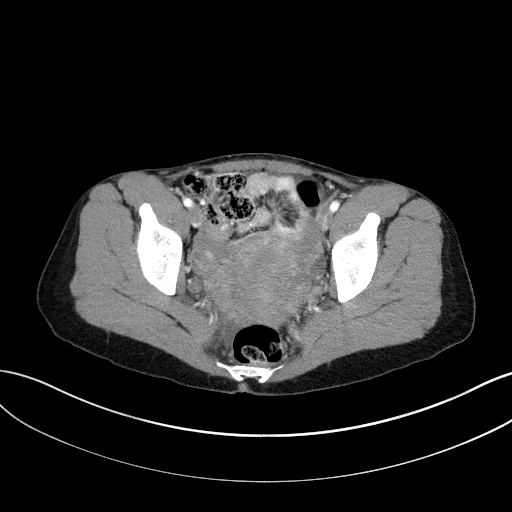
[im 43/129  mediastinal]
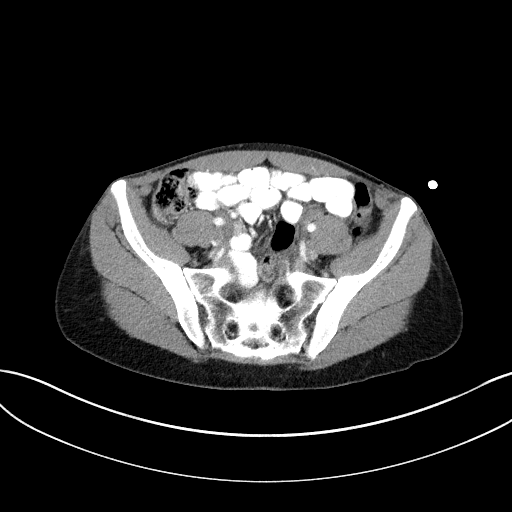
[im 54/129  mediastinal]
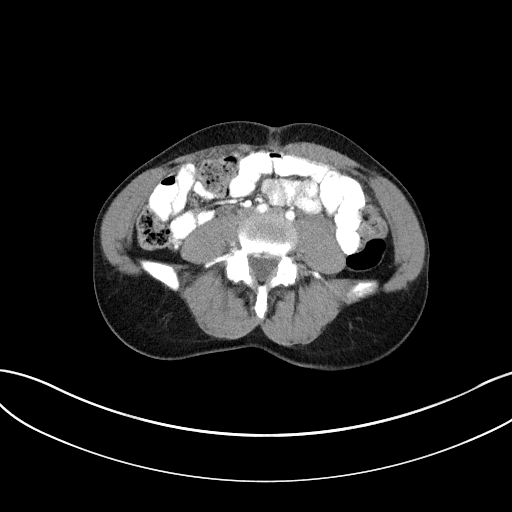
[im 75/129  mediastinal]
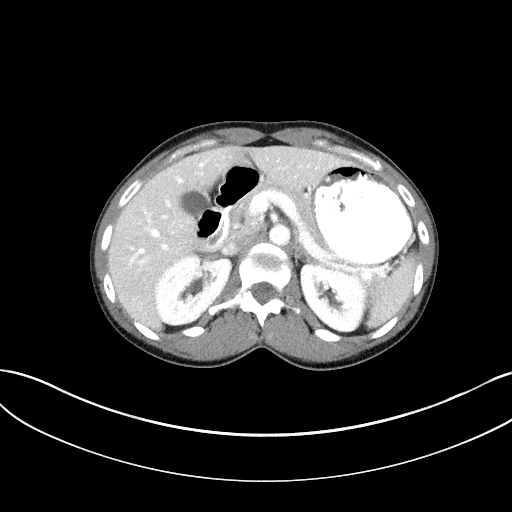
[im 86/129  mediastinal]
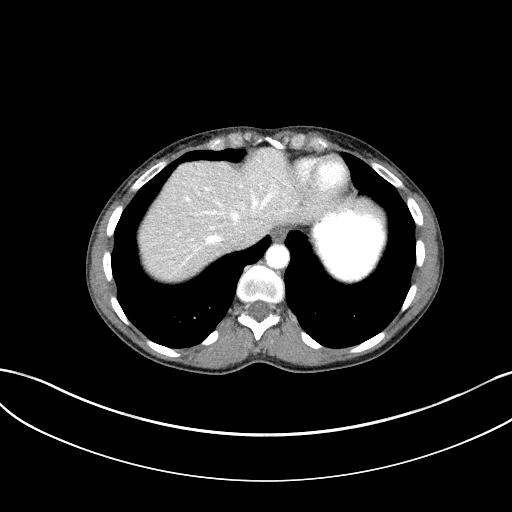
[im 97/129  mediastinal]
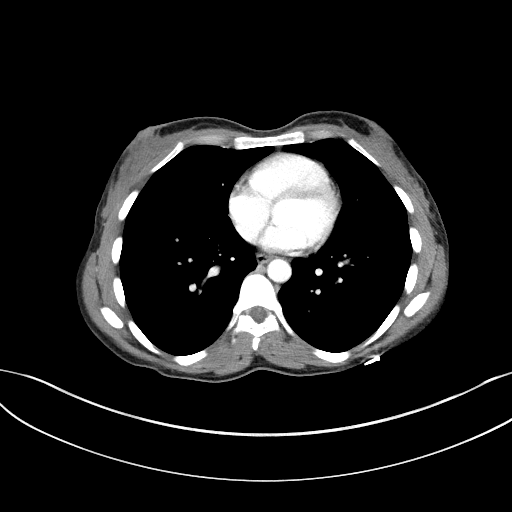
[im 107/129  mediastinal]
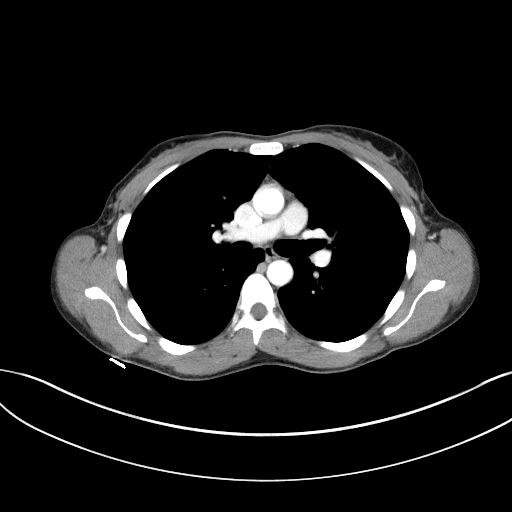
[im 107/129  bone]
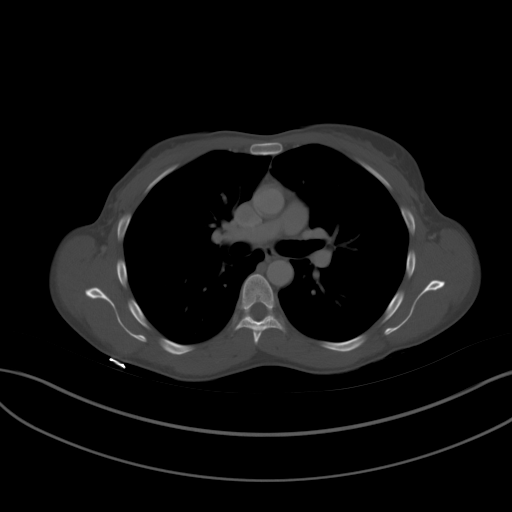
[im 118/129  mediastinal]
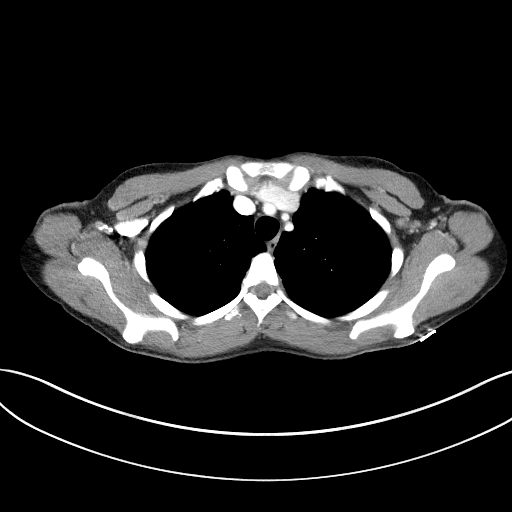

[Series 5: coronals · coronal · 0.64mm/px · 3 of 137 slices shown]
[im 28/137  mediastinal]
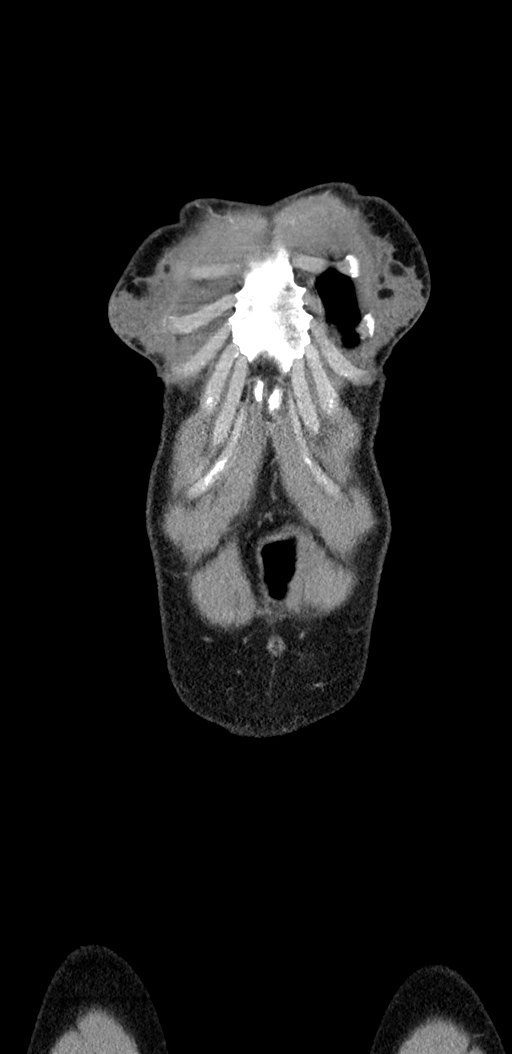
[im 55/137  mediastinal]
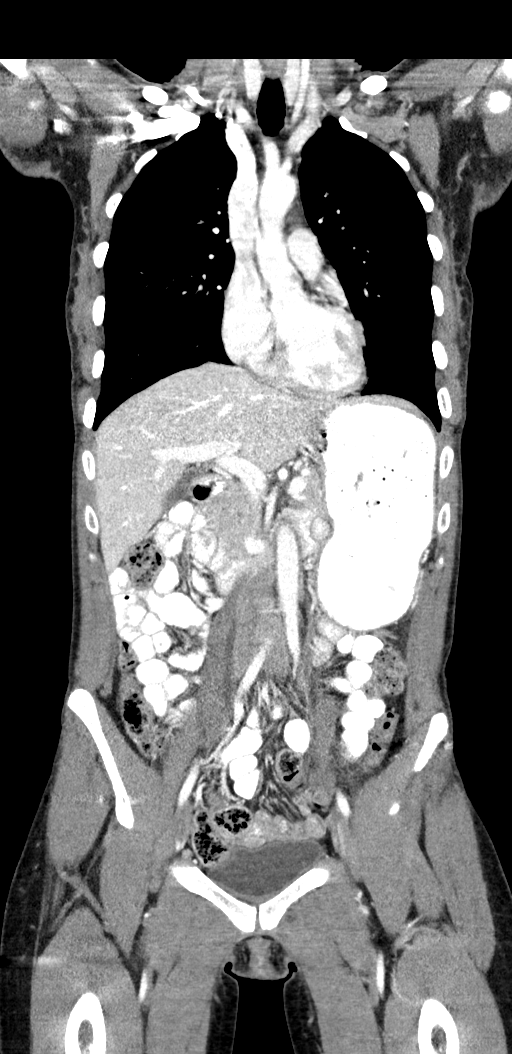
[im 82/137  mediastinal]
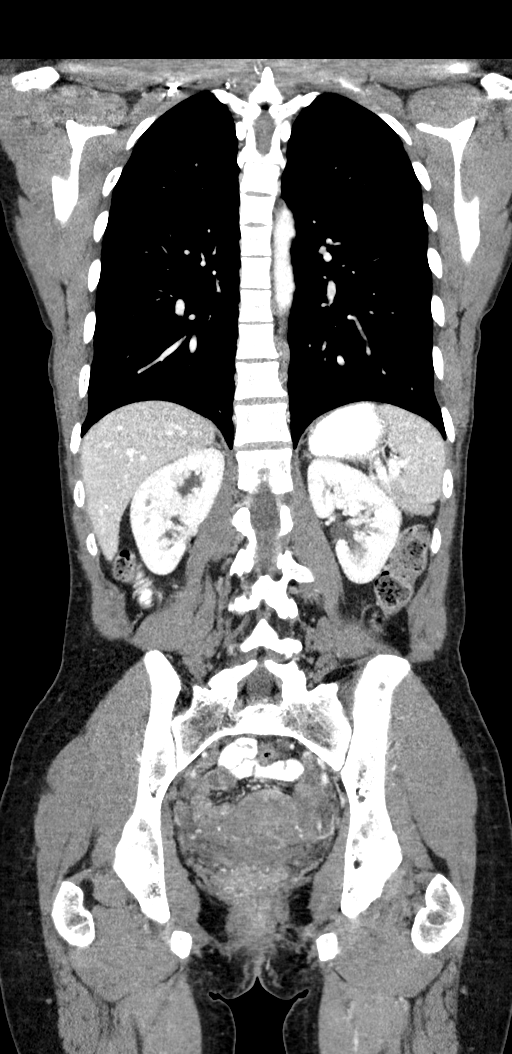

[13 of 36 positions shown; findings below may reference images not displayed]

FINDINGS: CT CHEST FINDINGS

Cardiovascular: Heart size is normal. There is no significant
pericardial fluid, thickening or pericardial calcification. No
atherosclerotic calcifications in the thoracic aorta or the coronary
arteries.

Mediastinum/Nodes: No pathologically enlarged mediastinal or hilar
lymph nodes. Esophagus is unremarkable in appearance. No axillary
lymphadenopathy.

Lungs/Pleura: No suspicious appearing pulmonary nodules or masses
are noted. No acute consolidative airspace disease. No pleural
effusions.

Musculoskeletal: There are no aggressive appearing lytic or blastic
lesions noted in the visualized portions of the skeleton.

CT ABDOMEN PELVIS FINDINGS

Hepatobiliary: No suspicious cystic or solid hepatic lesions. No
intra or extrahepatic biliary ductal dilatation. Gallbladder is
normal in appearance.

Pancreas: No pancreatic mass. No pancreatic ductal dilatation. No
pancreatic or peripancreatic fluid collections or inflammatory
changes.

Spleen: Unremarkable.

Adrenals/Urinary Tract: Bilateral kidneys and bilateral adrenal
glands are normal in appearance. No hydroureteronephrosis. Urinary
bladder is normal in appearance.

Stomach/Bowel: The appearance of the stomach is normal. There is no
pathologic dilatation of small bowel or colon. Normal appendix.

Vascular/Lymphatic: No significant atherosclerotic disease, aneurysm
or dissection noted in the abdominal or pelvic vasculature. No
lymphadenopathy noted in the abdomen or pelvis.

Reproductive: Uterus and ovaries are unremarkable in appearance.

Other: No significant volume of ascites.  No pneumoperitoneum.

Musculoskeletal: There are no aggressive appearing lytic or blastic
lesions noted in the visualized portions of the skeleton.
IMPRESSION: 1. No findings in the chest, abdomen or pelvis to account for the
patient's symptoms or unexplained weight loss.

## 2022-09-17 ENCOUNTER — Ambulatory Visit: Admit: 2022-09-17 | Discharge status: Against Medical Advice | Payer: Managed Care, Other (non HMO)

## 2024-02-02 ENCOUNTER — Other Ambulatory Visit: Payer: Self-pay | Admitting: Infectious Diseases

## 2024-02-02 DIAGNOSIS — Z1231 Encounter for screening mammogram for malignant neoplasm of breast: Secondary | ICD-10-CM

## 2024-02-28 ENCOUNTER — Encounter

## 2024-04-15 ENCOUNTER — Ambulatory Visit
Admission: RE | Admit: 2024-04-15 | Discharge: 2024-04-15 | Disposition: A | Discharge status: Home / Self Care | Attending: Emergency Medicine | Admitting: Emergency Medicine

## 2024-04-15 VITALS — BP 128/83 | HR 81 | Temp 98.2°F | Resp 18

## 2024-04-15 DIAGNOSIS — U071 COVID-19: Secondary | ICD-10-CM

## 2024-04-15 DIAGNOSIS — B349 Viral infection, unspecified: Secondary | ICD-10-CM | POA: Diagnosis not present

## 2024-04-15 LAB — POC SOFIA SARS ANTIGEN FIA: SARS Coronavirus 2 Ag: POSITIVE — AB

## 2024-04-15 MED ORDER — PAXLOVID (300/100) 20 X 150 MG & 10 X 100MG PO TBPK: 3.0000 | ORAL_TABLET | Freq: Two times a day (BID) | ORAL | 0 refills | Status: AC

## 2024-04-15 NOTE — Discharge Instructions (Signed)
 COVID-19 is a virus and should steadily improve in time it can take up to 7 to 10 days before you truly start to see a turnaround however things will get better  Per the CDC you will need to quarantine until you have 24 hours without a fever, once without fever please wear a mask but may continue activity as normal  Begin Paxlovid  taking twice daily for 5 days, this is a antiviral which reduces and suppresses the virus which helps to minimize symptoms, does not fully take away your illness  Paxlovid  can reduce the effectiveness of your birth control therefore if you are not wanting pregnancy you need to use a second method of contraception    You can take Tylenol and/or Ibuprofen as needed for fever reduction and pain relief.   For cough: honey 1/2 to 1 teaspoon (you can dilute the honey in water or another fluid).  You can also use guaifenesin and dextromethorphan for cough. You can use a humidifier for chest congestion and cough.  If you don't have a humidifier, you can sit in the bathroom with the hot shower running.      For sore throat: try warm salt water gargles, cepacol lozenges, throat spray, warm tea or water with lemon/honey, popsicles or ice, or OTC cold relief medicine for throat discomfort.   For congestion: take a daily anti-histamine like Zyrtec, Claritin, and a oral decongestant, such as pseudoephedrine.  You can also use Flonase  1-2 sprays in each nostril daily.   It is important to stay hydrated: drink plenty of fluids (water, gatorade/powerade/pedialyte, juices, or teas) to keep your throat moisturized and help further relieve irritation/discomfort.

## 2024-04-15 NOTE — ED Provider Notes (Signed)
 CAY RALPH PELT    CSN: [id number] Arrival date & time: 04/15/24  1041      History   Chief Complaint Chief Complaint  Patient presents with   Chills    Headache fever chills sore throat - Entered by patient    HPI Lazariah L Gundlach is a 43 y.o. female.   Patient presents for evaluation of fever peaking at 100.2, rhinorrhea, nonproductive cough, intermittent headaches and a sore throat beginning 1 day ago.  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  Tolerable to food and liquids.  Has attempted use of NyQuil.  Denies shortness of breath or wheezing.  Denies respiratory history, non-smoker.   Past Medical History:  Diagnosis Date   Anxiety    Episodic cluster headache    Iron deficiency anemia    Migraine with aura    Night sweats    Thought to be from SSRI   Renal insufficiency     Patient Active Problem List   Diagnosis Date Noted   Anxiety disorder 05/08/2015   Migraine with aura    Iron deficiency anemia    Renal insufficiency    Episodic cluster headache     Past Surgical History:  Procedure Laterality Date   HERNIA REPAIR  2011    OB History   No obstetric history on file.      Home Medications    Prior to Admission medications   Medication Sig Start Date End Date Taking? Authorizing Provider  DULoxetine (CYMBALTA) 30 MG capsule Take 30 mg by mouth. 02/04/24  Yes [provider]  nirmatrelvir/ritonavir (PAXLOVID , 300/100,) 20 x 150 MG & 10 x 100MG  TBPK Take 3 tablets by mouth 2 (two) times daily for 5 days. Patient GFR is 68. Take nirmatrelvir (150 mg) two tablets twice daily for 5 days and ritonavir (100 mg) one tablet twice daily for 5 days. 04/15/24 04/20/24 Yes Teresa Shelba SAUNDERS, NP  norethindrone (MICRONOR) 0.35 MG tablet Take 0.35 mg by mouth. 02/04/24 02/03/25 Yes [provider]  nortriptyline (PAMELOR) 25 MG capsule Take 1 capsule by mouth at bedtime. 01/13/24  Yes [provider]   busPIRone (BUSPAR) 5 MG tablet Take 5 mg by mouth 2 (two) times daily.    [provider]  doxycycline  (VIBRAMYCIN ) 100 MG capsule Take 1 capsule (100 mg total) by mouth 2 (two) times daily. Patient not taking: Reported on 04/15/2024 04/27/19   Elnor Dorise BRAVO, NP  predniSONE  (STERAPRED UNI-PAK 21 TAB) 10 MG (21) TBPK tablet Take 6 tabs by mouth daily  for 2 days, then 5 tabs for 2 days, then 4 tabs for 2 days, then 3 tabs for 2 days, 2 tabs for 2 days, then 1 tab by mouth daily for 2 days Patient not taking: Reported on 04/15/2024 04/27/19   Elnor Dorise BRAVO, NP  SUMAtriptan (IMITREX) 100 MG tablet Take 100 mg by mouth as directed.    [provider]  fluticasone  (FLONASE ) 50 MCG/ACT nasal spray Place 1 spray into both nostrils daily. 12/24/17 04/27/19  Cleotilde Jacobsen, NP  medroxyPROGESTERone (DEPO-PROVERA) 150 MG/ML injection inject 1 milliliter intramuscularly every 11 to 12 WEEKS 02/28/16 04/27/19  [provider]  sertraline  (ZOLOFT ) 100 MG tablet Take 1 tablet (100 mg total) by mouth daily. 10/26/15 04/27/19  Vicci Duwaine SQUIBB, DO    Family History Family History  Problem Relation Age of Onset   Migraines Mother    Alzheimer's disease Maternal Grandmother    Hypertension Paternal  Grandmother    Hypertension Father     Social History Social History   Tobacco Use   Smoking status: Never   Smokeless tobacco: Never  Vaping Use   Vaping status: Never Used  Substance Use Topics   Alcohol use: Not Currently    Comment: Rarely   Drug use: No     Allergies   Patient has no known allergies.   Review of Systems Review of Systems   Physical Exam Triage Vital Signs ED Triage Vitals  Encounter Vitals Group     BP 04/15/24 1051 128/83     Girls Systolic BP Percentile --      Girls Diastolic BP Percentile --      Boys Systolic BP Percentile --      Boys Diastolic BP Percentile --      Pulse Rate 04/15/24 1051 81     Resp 04/15/24 1051 18     Temp 04/15/24 1051  98.2 F (36.8 C)     Temp src --      SpO2 04/15/24 1051 99 %     Weight --      Height --      Head Circumference --      Peak Flow --      Pain Score 04/15/24 1058 6     Pain Loc --      Pain Education --      Exclude from Growth Chart --    No data found.  Updated Vital Signs BP 128/83   Pulse 81   Temp 98.2 F (36.8 C)   Resp 18   SpO2 99%   Visual Acuity Right Eye Distance:   Left Eye Distance:   Bilateral Distance:    Right Eye Near:   Left Eye Near:    Bilateral Near:     Physical Exam Constitutional:      Appearance: Normal appearance.  HENT:     Head: Normocephalic.     Right Ear: Tympanic membrane, ear canal and external ear normal.     Left Ear: Tympanic membrane, ear canal and external ear normal.     Nose: Congestion present.     Mouth/Throat:     Pharynx: Posterior oropharyngeal erythema present. No oropharyngeal exudate.  Eyes:     Extraocular Movements: Extraocular movements intact.  Cardiovascular:     Rate and Rhythm: Normal rate and regular rhythm.     Pulses: Normal pulses.     Heart sounds: Normal heart sounds.  Pulmonary:     Effort: Pulmonary effort is normal.     Breath sounds: Normal breath sounds.  Musculoskeletal:     Cervical back: Normal range of motion and neck supple.  Neurological:     Mental Status: She is alert and oriented to person, place, and time. Mental status is at baseline.      UC Treatments / Results  Labs (all labs ordered are listed, but only abnormal results are displayed) Labs Reviewed  POC SOFIA SARS ANTIGEN FIA - Abnormal; Notable for the following components:      Result Value   SARS Coronavirus 2 Ag Positive (*)    All other components within normal limits    EKG   Radiology No results found.  Procedures Procedures (including critical care time)  Medications Ordered in UC Medications - No data to display  Initial Impression / Assessment and Plan / UC Course  I have reviewed the triage  vital signs and the nursing notes.  Pertinent  labs & imaging results that were available during my care of the patient were reviewed by me and considered in my medical decision making (see chart for details).  COVID-19  Patient is in no signs of distress nor toxic appearing.  Vital signs are stable.  Low suspicion for pneumonia, pneumothorax or bronchitis and therefore will defer imaging.  Discussed CDC quarantine guidelines.  Prescribed Paxlovid  and discussed administration.May use additional over-the-counter medications as needed for supportive care.  May follow-up with urgent care as needed if symptoms persist or worsen.  Note given.   Final Clinical Impressions(s) / UC Diagnoses   Final diagnoses:  Viral illness  COVID-19     Discharge Instructions      COVID-19 is a virus and should steadily improve in time it can take up to 7 to 10 days before you truly start to see a turnaround however things will get better  Per the CDC you will need to quarantine until you have 24 hours without a fever, once without fever please wear a mask but may continue activity as normal  Begin Paxlovid  taking twice daily for 5 days, this is a antiviral which reduces and suppresses the virus which helps to minimize symptoms, does not fully take away your illness  Paxlovid  can reduce the effectiveness of your birth control therefore if you are not wanting pregnancy you need to use a second method of contraception    You can take Tylenol and/or Ibuprofen as needed for fever reduction and pain relief.   For cough: honey 1/2 to 1 teaspoon (you can dilute the honey in water or another fluid).  You can also use guaifenesin and dextromethorphan for cough. You can use a humidifier for chest congestion and cough.  If you don't have a humidifier, you can sit in the bathroom with the hot shower running.      For sore throat: try warm salt water gargles, cepacol lozenges, throat spray, warm tea or water with  lemon/honey, popsicles or ice, or OTC cold relief medicine for throat discomfort.   For congestion: take a daily anti-histamine like Zyrtec, Claritin, and a oral decongestant, such as pseudoephedrine.  You can also use Flonase  1-2 sprays in each nostril daily.   It is important to stay hydrated: drink plenty of fluids (water, gatorade/powerade/pedialyte, juices, or teas) to keep your throat moisturized and help further relieve irritation/discomfort.    ED Prescriptions     Medication Sig Dispense Auth. Provider   nirmatrelvir/ritonavir (PAXLOVID , 300/100,) 20 x 150 MG & 10 x 100MG  TBPK Take 3 tablets by mouth 2 (two) times daily for 5 days. Patient GFR is 68. Take nirmatrelvir (150 mg) two tablets twice daily for 5 days and ritonavir (100 mg) one tablet twice daily for 5 days. 30 tablet Jemar Paulsen R, NP      PDMP not reviewed this encounter.   Teresa Shelba SAUNDERS, NP 04/15/24 1120

## 2024-04-15 NOTE — ED Triage Notes (Signed)
 Patient to Urgent Care with complaints of headaches/ fevers (max temp 100.2) / chills/ sore throat.  Symptoms started yesterday.   Meds: Nyquil.
